# Patient Record
Sex: Male | Born: 1990 | Race: Black or African American | Hispanic: No | Marital: Single
Health system: Southern US, Community
[De-identification: ages and names within clinical notes are randomized; demographics above are authoritative.]

---
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
## 2019-03-04 ENCOUNTER — Other Ambulatory Visit: Payer: Self-pay

## 2019-03-04 ENCOUNTER — Other Ambulatory Visit: Payer: Self-pay | Admitting: Internal Medicine

## 2019-03-04 DIAGNOSIS — Z20822 Contact with and (suspected) exposure to covid-19: Secondary | ICD-10-CM

## 2019-03-08 LAB — NOVEL CORONAVIRUS, NAA: SARS-CoV-2, NAA: NOT DETECTED

## 2019-03-09 ENCOUNTER — Telehealth: Payer: Self-pay | Admitting: *Deleted

## 2019-03-09 NOTE — Telephone Encounter (Signed)
Spoke with patient.  Notified him of negative COVID 19 test results.

## 2019-03-09 NOTE — Telephone Encounter (Signed)
-----   Message from Wynona Luna, MD sent at 03/09/2019  8:57 AM EDT ----- Regarding: negative covid result
